# Patient Record
Sex: Female | Born: 1975 | Race: Black or African American | Hispanic: No | Marital: Married | Smoking: Current every day smoker
Health system: Southern US, Community
[De-identification: ages and names within clinical notes are randomized; demographics above are authoritative.]

## PROBLEM LIST (undated history)

## (undated) DIAGNOSIS — F259 Schizoaffective disorder, unspecified: Secondary | ICD-10-CM

## (undated) DIAGNOSIS — F603 Borderline personality disorder: Secondary | ICD-10-CM

## (undated) DIAGNOSIS — F209 Schizophrenia, unspecified: Secondary | ICD-10-CM

---

## 1998-01-07 ENCOUNTER — Emergency Department (HOSPITAL_COMMUNITY): Admission: EM | Admit: 1998-01-07 | Discharge: 1998-01-07 | Payer: Self-pay | Admitting: Emergency Medicine

## 1998-01-08 ENCOUNTER — Inpatient Hospital Stay (HOSPITAL_COMMUNITY): Admission: AD | Admit: 1998-01-08 | Discharge: 1998-01-13 | Payer: Self-pay | Admitting: *Deleted

## 2000-10-24 ENCOUNTER — Inpatient Hospital Stay (HOSPITAL_COMMUNITY): Admission: RE | Admit: 2000-10-24 | Discharge: 2000-10-24 | Payer: Self-pay | Admitting: *Deleted

## 2000-10-27 ENCOUNTER — Inpatient Hospital Stay (HOSPITAL_COMMUNITY): Admission: AD | Admit: 2000-10-27 | Discharge: 2000-10-27 | Payer: Self-pay | Admitting: *Deleted

## 2000-10-28 ENCOUNTER — Encounter (INDEPENDENT_AMBULATORY_CARE_PROVIDER_SITE_OTHER): Payer: Self-pay | Admitting: Specialist

## 2000-10-28 ENCOUNTER — Ambulatory Visit (HOSPITAL_COMMUNITY): Admission: RE | Admit: 2000-10-28 | Discharge: 2000-10-28 | Payer: Self-pay | Admitting: Obstetrics

## 2000-11-23 ENCOUNTER — Inpatient Hospital Stay (HOSPITAL_COMMUNITY): Admission: EM | Admit: 2000-11-23 | Discharge: 2000-11-25 | Payer: Self-pay | Admitting: Psychiatry

## 2001-02-09 ENCOUNTER — Encounter: Admission: RE | Admit: 2001-02-09 | Discharge: 2001-02-09 | Payer: Self-pay | Admitting: Infectious Diseases

## 2001-06-18 ENCOUNTER — Emergency Department (HOSPITAL_COMMUNITY): Admission: EM | Admit: 2001-06-18 | Discharge: 2001-06-18 | Payer: Self-pay

## 2001-07-02 ENCOUNTER — Inpatient Hospital Stay (HOSPITAL_COMMUNITY): Admission: EM | Admit: 2001-07-02 | Discharge: 2001-07-06 | Payer: Self-pay | Admitting: Psychiatry

## 2001-07-28 ENCOUNTER — Encounter: Payer: Self-pay | Admitting: Emergency Medicine

## 2001-07-28 ENCOUNTER — Emergency Department (HOSPITAL_COMMUNITY): Admission: EM | Admit: 2001-07-28 | Discharge: 2001-07-28 | Payer: Self-pay | Admitting: Emergency Medicine

## 2001-08-02 ENCOUNTER — Inpatient Hospital Stay (HOSPITAL_COMMUNITY): Admission: EM | Admit: 2001-08-02 | Discharge: 2001-08-04 | Payer: Self-pay | Admitting: Psychiatry

## 2002-06-21 ENCOUNTER — Other Ambulatory Visit: Admission: RE | Admit: 2002-06-21 | Discharge: 2002-06-21 | Payer: Self-pay | Admitting: Nephrology

## 2002-06-25 ENCOUNTER — Encounter: Admission: RE | Admit: 2002-06-25 | Discharge: 2002-06-25 | Payer: Self-pay | Admitting: Nephrology

## 2002-06-25 ENCOUNTER — Encounter: Payer: Self-pay | Admitting: Nephrology

## 2004-04-24 ENCOUNTER — Emergency Department (HOSPITAL_COMMUNITY): Admission: AC | Admit: 2004-04-24 | Discharge: 2004-04-24 | Payer: Self-pay

## 2008-04-22 ENCOUNTER — Encounter: Admission: RE | Admit: 2008-04-22 | Discharge: 2008-04-22 | Payer: Self-pay | Admitting: Unknown Physician Specialty

## 2009-05-06 ENCOUNTER — Emergency Department (HOSPITAL_COMMUNITY): Admission: EM | Admit: 2009-05-06 | Discharge: 2009-05-06 | Payer: Self-pay | Admitting: Emergency Medicine

## 2010-01-25 ENCOUNTER — Encounter: Payer: Self-pay | Admitting: Unknown Physician Specialty

## 2010-05-22 NOTE — Discharge Summary (Signed)
NAME:  Cynthia Good, Cynthia Good                       ACCOUNT NO.:  [id number]   MEDICAL RECORD NO.:  [id number]                   PATIENT TYPE:  PS   LOCATION:  0407                                 FACILITY:  [facility]   PHYSICIAN:  Reymundo Poll. Dub Mikes, M.D.                DATE OF BIRTH:  1975-10-08   DATE OF ADMISSION:  07/02/2001  DATE OF DISCHARGE:  07/06/2001                                 DISCHARGE SUMMARY   CHIEF COMPLAINT AND PRESENT ILLNESS:  This was one of multiple admissions to  [facility] for this 35 year old female referred by  [facility].  She was picked up by police naked, trying to take beer out  of a convenience store.  Thoughts were grossly disorganized.  Dancing and  singing, writing bizarre messages, paranoid, hostile, agitated during the  admission process.  Continued to be agitated and hostile 24 hours after  initial evaluation.   PAST PSYCHIATRIC HISTORY:  [facility], Susie  Venice, Willy Eddy two weeks ago, [facility] November 2002.  Multiple psychiatric admissions.  Previously on lithium and Haldol.   ALCOHOL/DRUG HISTORY:  History of alcohol and marijuana abuse.   PAST MEDICAL HISTORY:  Noncontributory.   MEDICATIONS:  Noncompliant.   PHYSICAL EXAMINATION:  Performed and failed to show any acute findings.   MENTAL STATUS EXAM:  Alert, overweight African-American female.  Normal  motor activity.  Agitated, intermittently distracted, disorganized thoughts  and guarded.  Paranoid manner.  Speech was pressured.  Mood hostile and  angry.  Some paranoia in her thinking process.  Oriented to person and  place.   ADMISSION DIAGNOSES:   AXIS I:  Schizoaffective disorder, manic.   AXIS II:  No diagnosis.   AXIS III:  No diagnosis.   AXIS IV:  Moderate.   AXIS V:  Global Assessment of Functioning upon admission 25; highest Global  Assessment of Functioning in the last year 50-55.   LABORATORY DATA:  CBC  was within normal limits.  Blood chemistries were  within normal limits.  Thyroid profile was within normal limits.  Drug  screen was positive for marijuana.  Other labs performed on the unit  included serum pregnancy was negative.  HIV was negative.  RPR was  nonreactive.  Chlamydia probe was nonreactive.   HOSPITAL COURSE:  She was admitted and started intensive individual and  group psychotherapy.  Medications were adjusted.  She was willing to go back  on the Haldol.  We eventually ended up giving her Haldol 5 mg twice a day,  Abilify 15 mg at bedtime, lithium carbonate 300 mg twice a day.  She was  also given Cogentin 1 mg twice a day and Seroquel 100 mg, 1-2 at bedtime for  sleep.  The first part of the stay was characterized as being intrusive,  anxious, irritable.  She was wanting to be discharged.  As she was not  allowed to leave, she escalated, becoming more intrusive, had to be  redirected over and over again.  By July 06, 2001, since she had complied  with treatment, was wanting to be discharged.  Being in the hospital was  becoming more of an issue for her.  There was no evidence of acute  symptomatology at that particular time.  Denied any suicidal or homicidal  ideation.  Was willing to pursue outpatient treatment.  In view of the fact  that staying on the unit was not helping but making things worse for her,  she was discharged to outpatient follow-up.   DISCHARGE DIAGNOSES:   AXIS I:  Schizoaffective disorder, manic.   AXIS II:  No diagnosis.   AXIS III:  No diagnosis.   AXIS IV:  Moderate.   AXIS V:  Global Assessment of Functioning upon discharge 50-55.   DISCHARGE MEDICATIONS:  1. Lithobid 300 mg twice a day.  2. Abilify 15 mg at bedtime.  3. Haldol 5 mg twice a day.  4. Cogentin 10 mg twice a day.  5. Seroquel 100 mg, 1-2 at bedtime.   FOLLOW UP:  [facility].                                               Madie Reno A. Dub Mikes, M.D.    IAL/MEDQ  D:   08/09/2001  T:  08/10/2001  Job:  [id number]

## 2010-05-22 NOTE — Op Note (Signed)
[facility]  Patient:    Cynthia Good, Cynthia Good Visit Number: [id number] MRN: [id number]          Service Type: DSU Location: [facility] Attending Physician:  Tammi Sou Dictated by:   Bing Neighbors Clearance Coots, M.D. Proc. Date: 10/28/00 Admit Date:  10/28/2000 Discharge Date: 10/28/2000   CC:         [facility]   Operative Report  PREOPERATIVE DIAGNOSIS:       Intrauterine fetal demise at seven weeks                               gestation by ultrasound.  POSTOPERATIVE DIAGNOSIS:      Intrauterine fetal demise at seven weeks                               gestation by ultrasound.  OPERATION:                    Dilation and evacuation.  SURGEON:                      Charles A. Clearance Coots, M.D.  ANESTHESIA:                   MAC with paracervical block.  ESTIMATED BLOOD LOSS:         100 ml.  COMPLICATIONS:                None.  SPECIMENS:                    Products of conception.  DESCRIPTION OF PROCEDURE:     The patient was brought to the operating room and after satisfactory IV sedation, the legs were brought up in stirrups, and the vagina was prepped and draped in the usual sterile fashion.  The urinary bladder was emptied of approximately 50 ml of clear urine.  Bimanual examination revealed the uterus to be mid position, about 7-8 weeks size.  A sterile speculum was inserted in the vaginal vault and the cervix was isolated.  The anterior lip of the cervix was grasped with a single tooth tenaculum.  Paracervical block with 2% Xylocaine with 2 ml of sodium bicarbonate, approximately 20 ml was injected in the lateral fornices, approximately 8 ml in each lateral fornix at 3 and 9 oclock position, and about 4 ml on the anterior lip of the cervix prior to application of the single tooth tenaculum.  The uterus was then sounded and the cervix was dilated to approximately 25 ml with the [facility] dilator.  A #8 suction cath  was then easily introduced into the uterine cavity and all contents were evacuated.  The endometrial surfaces were curetted with a small sharp curet and no further products of conception were obtained.  There was no active bleeding at the conclusion of the procedure.  All instruments were ______.  The patient tolerated the procedure well and was transported to the recovery room in satisfactory condition. Dictated by:   Bing Neighbors Clearance Coots, M.D. Attending Physician:  Tammi Sou DD:  10/29/00 TD:  10/31/00 Job: [id number]

## 2010-05-22 NOTE — Discharge Summary (Signed)
[facility]  Patient:    Cynthia Good, Cynthia Good Visit Number: [id number] MRN: [id number]          Service Type: PSY Location: 400 [id number] [id number] Attending Physician:  Rachael Fee Dictated by:   Reymundo Poll Dub Mikes, M.D. Admit Date:  11/23/2000 Discharge Date: 11/25/2000                             Discharge Summary  CHIEF COMPLAINT AND PRESENT ILLNESS:  This was the first admission to [facility] for this 35 year old female involuntarily committed. Reported that she took 12 mg of Haldol and 6 mg of Cogentin to go to sleep. Has not slept in 24 hours.  Presented herself to mental health complaining of insomnia and feeling manic, walking 3-4 miles per day, sleeping less than five hours in the last week, frequent unprotected sex, has been off Haldol and lithium since September, when he told mental health worker that she was pregnant.  Subsequently lost the baby.  Denied any auditory or visual hallucinations.  Promised safety while on the unit.  PAST PSYCHIATRIC HISTORY:  Followed at [facility], [place] ______.  Fourth psychiatric admission.  Last in [facility] October 2002.  ALCOHOL/DRUG HISTORY:  Drinks beer 24 hours daily.  No drugs.  Smokes marijuana occasionally.  MEDICAL PROBLEMS:  Positive for STDs.  No seizures.  MEDICATIONS:  Haldol 4 mg h.s., lithium 300 mg, 2 tabs b.i.d.  MENTAL STATUS EXAMINATION:  Alert, obese female, cooperative, polite. Irritable affect.  Rapid pace.  Speech relevant and appropriate response. Mood irritable and labile.  Thought process logical but distractible. Initially tangential.  No suicidal or homicidal ideation.  Cognition well-preserved.  ADMISSION DIAGNOSES: Axis I:    1. Bipolar disorder.            2. Alcohol dependence. Axis II:   Deferred. Axis III:  High blood pressure. Axis IV:   Moderate. Axis V:    Global Assessment of Functioning upon admission 30; highest Global  Assessment of Functioning in the last year 68.  LABORATORY DATA:  CBC was within normal limits.  Blood chemistries were within normal limits.  Thyroid profile was within normal limits.  Drug screen was negative for substances of abuse.  HOSPITAL COURSE:  She was admitted and started intensive individual and group psychotherapy.  She was placed back on her Haldol and Cogentin.  She claimed that she started feeling better.  She was not feeling as hyper as she was when she was admitted.  She was wanting to be discharged.  Social worker, in conversation with the family, stated she was going to be returning home.  On November 25, 2000, in full contact with reality.  Mood improved.  Affect bright.  No active psychosis.  No pressured speech.  No hyperactivity.  No psychomotor agitation.  No flight of ideas.  Wanted to be discharged and stay with the boyfriend.  Did not want to listen to her aunt.  As there were no suicidal ideation, no homicidal ideation, no active psychosis and she was in full contact with reality, she was discharged to outpatient followup.  DISCHARGE DIAGNOSES: Axis I:    1. Bipolar disorder, manic.            2. Alcohol abuse. Axis II:   No diagnosis. Axis III:  High blood pressure. Axis IV:   Moderate. Axis V:    Global Assessment of Functioning upon  discharge 55-60.  DISCHARGE MEDICATIONS: 1. Haldol 2 mg, 3 q.d. 2. Lithobid 300 mg, two b.i.d. 3. Cogentin 1 mg b.i.d.  FOLLOWUP:  [facility]. Dictated by:   Reymundo Poll Dub Mikes, M.D. Attending Physician:  Rachael Fee DD:  01/10/01 TD:  01/11/01 Job: [id number]

## 2010-05-22 NOTE — H&P (Signed)
NAME:  Cynthia Good, Cynthia Good                       ACCOUNT NO.:  [id number]   MEDICAL RECORD NO.:  [id number]                   PATIENT TYPE:  IPS   LOCATION:  0407                                 FACILITY:  [facility]   PHYSICIAN:  Viviann Spare, N.P.              DATE OF BIRTH:  04-Aug-1975   DATE OF ADMISSION:  08/02/2001  DATE OF DISCHARGE:                         PSYCHIATRIC ADMISSION ASSESSMENT   IDENTIFYING INFORMATION:  This is a 35 year old, African-American female who  is single and voluntary admission.   HISTORY OF PRESENT ILLNESS:  This patient was picked up by police wandering  around naked downtown. She was hostile, paranoid and agitated in the  emergency room and had to be restrained. She was treated there with 20 mg of  Geodon and was calmer but remained uncooperative. She was involuntary  petitioned by the emergency room physician. Today, she is somewhat calmer  but obviously continues to be internally distracted. She is oppositional,  pacing, hyper verbal and refusing p.o. meds. She redirects with difficulty.  It is noted that her previous admission here at [facility] was under identical circumstances. She was discharged from here on  July 3 and has apparently been noncompliant with her medications.   PAST PSYCHIATRIC HISTORY:  This is the second admission to [facility]. The patient's full psychiatric history is unclear  and she is a poor historian being unable to provide information. She does  have a past history of ETOH abuse and in a previous record noted that she  had been trying to take beer from a convenience store and has reported that  she drinks beer all day long every day as much as she can get. She has shown  no signs of withdrawal on this or prior admission. Her urine drug screen on  this admission is positive for THC. The patient has refused Zyprexa on a  previous admission stating that it caused her weight  gain and she had  reported previously that she had been well stabilized on Neurontin, Abilify,  Haldol and Klonopin which she was willing to take. She had apparently been  followed by [facility] but her compliance is unclear.   SOCIAL HISTORY:  This is a single, African-American female, never married,  no children. She does have a multi-language background. She lives with her  aunt in [place]. She apparently is not working although her disability  status and source of income is unclear.   FAMILY HISTORY:  Unclear.   ALCOHOL AND DRUG HISTORY:  The patient does have some history of  polysubstance abuse as noted above. Urine drug screen was positive for THC  in the emergency room.   MEDICAL HISTORY:  Primary care Dhanvi Boesen is unclear. Medical problems include  elevated blood pressure on admission which has normalized by today. The  patient denies any other acute problems. Past medical history, according to  previous record, is remarkable for sexually transmitted diseases and she was  screened on a previous admission with negative results.   MEDICATIONS:  None. She has apparently not taken any medications. She was  discharged on July 3 on Litobid 300 mg p.o. twice daily, Abilify 15 mg at  bedtime, Haldol 5 mg p.o. b.i.d., Cogentin 1 mg twice daily, Seroquel 100 mg  one to two tablets at bedtime.   DRUG ALLERGIES:  ZYPREXA causes weight gain, DEPAKOTE causes rash. The  patient also reports she is allergic to TYLENOL, RISPERDAL and BENADRYL.   POSITIVE PHYSICAL FINDINGS:  They were unable to do a physical exam on this  patient in the ER because of her agitation and her refusal to cooperate. She  continues to be unable to tolerate a physical exam at this time although she  appears to be generally healthy in appearance. Her gait is steady, grossly  normal motor exam. Her vital signs are within normal limits now this  morning. Her temp was 97.4 on admission, pulse 90,  respirations 20 and her  blood pressure this morning 124/86. Her pulse ox is 97 percent. She weighed  231 on admission and is approximately 5 feet 5 inches tall. She was taking  fluids well on the p.m. shift last night having received about 420 cc fluid  noted around midnight. Initial diagnostic studies done reveal a hemoglobin  14, hematocrit 41. Her electrolytes were within normal limits with a  potassium 3.5, sodium 142, a BUN of  7, creatinine 1.7. A urine pregnancy  test was negative. A urine drug screen was positive for  tetrahydrocannabinols. Her urinalysis revealed cloudy urine with a specific  gravity of 1.039. She did have T counts of 15, a moderate bilirubin,  moderate amount of blood, 30 mg of protein, nitrites negative, leukocytes  moderate and squamous epithelial many, WBC's revealed 2 per high power field  and RBC's 21:50. The patient is unable to give a reliable history about her  menses.   MENTAL STATUS EXAM:  This is a large-built, African-American female with  grossly normal motor function. No EPS or TD are noted at this time. She is  fully alert with a blunted affect. She is pacing, redirects with moderate  difficulty. Her affect is labile. Speech is pressured and hyper verbal with  considerable response latency. Mood is labile and irritable. She is quite  oppositional. Thought process shows considerable latency, racing thoughts.  She is obviously distracted. She appears to be internally distracted. Her  thoughts are disorganized and she displays flight of ideas. Mood is somewhat  hyper-religious. She is concerned about what church people belong to and  wanting to only deal with individuals from a specific church denomination.  She was also hyper sexual referencing sexual encounters in her conversation  yesterday. No overt evidence of suicidal ideation or homicidal ideation. The patient's primary concern is to be left alone, not take any medicine and get  out of here.  Cognitively, she is intact to person and place but not to time  or date. She is an unreliable historian. Insight is nonexistent.  Intelligence is average to above-average, judgment is impaired, impulse  control is impaired.   AXIS I:  1. Bipolar I Disorder, Manic.  2. Cannabis abuse.  3. History of ETOH abuse.   AXIS II:  Deferred.   AXIS III:  None.   AXIS IV:  Deferred.   AXIS V:  Current: 20, Past year: 62.   PLAN:  Plan is to involuntary admit the patient to stabilize her mood and to  alleviated her psychosis with 1:1 observation for safety in place at this  time and this will continue until she is redirectible and cooperative. We  will ask the case manager to contact the patient's aunt for additional  history. We have initially treated her with Geodon 20 mg IM in the emergency  room yesterday morning followed by an additional 20 mg of Geodon along with  2 mg of Ativan IM approximately 4:15 p.m. yesterday. This was followed by  continued refusal to take p.o. meds and we initiated it around bedtime. She  had an additional Haldol 5 mg IM and Ativan 2 mg IM last evening and again  at  6:30 this morning. We will continue with Haldol 5 mg IM or p.o. and Ativan 2  mg IM or p.o. q.4h. p.r.n. Meanwhile, her thyroid and lithium levels are  currently pending and we will attempt to gather some additional history. We  are monitoring fluid intake on her and getting her vital signs q.i.d.  Estimated length of stay is 10 days.                                               Viviann Spare, N.P.    MAS/MEDQ  D:  08/03/2001  T:  08/04/2001  Job:  [phone]

## 2010-05-22 NOTE — Discharge Summary (Signed)
NAME:  Cynthia Good, Cynthia Good                       ACCOUNT NO.:  [id number]   MEDICAL RECORD NO.:  [id number]                  PATIENT TYPE:  IPS   LOCATION:  0407                                 FACILITY:  [facility]   PHYSICIAN:  Geoffery Lyons, MD                     DATE OF BIRTH:  15-Aug-1975   DATE OF ADMISSION:  08/02/2001  DATE OF DISCHARGE:  08/04/2001                                 DISCHARGE SUMMARY   CHIEF COMPLAINT AND PRESENT ILLNESS:  This was the second admission to [facility] for this 35 year old female, single,  involuntarily admitted.  She was picked up by police wandering around naked  [place]town.  She was hostile, paranoid, agitated, had to be restrained.  She  was treated with 20 mg Geodon.  She was calm but remained uncooperative.  She was involuntarily petitioned by the emergency room physician.  She was  somewhat calmer upon this evaluate although actively hallucinating,  distractible, hyperverbal, anxious, easily agitated, and with threatening  behavior.  She was noncompliant with medications.   PAST PSYCHIATRIC HISTORY:  Previously at [facility]; noncompliant with medications once discharged.  She had a previous  history of alcohol use, reported that she drinks all day long as much as she  can.   SUBSTANCE ABUSE HISTORY:  As already stated, increased use of alcohol.  Drug  screen was positive for marijuana.   PAST MEDICAL HISTORY:  Sexually transmitted disease; previous admission with  negative results.   MEDICATIONS:  She was discharged on July 3 on:  1. Lithobid 300 mg twice a day.  2. Abilify 15 mg at bedtime.  3. Haldol 5 mg twice a day.  4. Cogentin 1 mg twice a day.  5. Seroquel 100 mg one to two at bedtime.   PHYSICAL EXAMINATION:  Physical examination was performed, failed to show  any acute findings.   MENTAL STATUS EXAM:  Mental status exam revealed a large built, African-  American female, grossly  normal motor functioning, no ________ or DT, fully  alert, blunted affect.  Speech: Speech latency.  Very labile, very  irritable, angry, oppositional, racing thoughts, hyperreligious, concerned  about the church people, hypersexual.  Cognitive: Cognition affected by the  acute psychotic process.   ADMISSION DIAGNOSES:   AXIS I:  1. Schizoaffective disorder, manic.  2. Alcohol and marijuana abuse.   AXIS II:  No diagnosis.   AXIS III:  No diagnosis.   AXIS IV:  Moderate.   AXIS V:  Global assessment of functioning upon admission 25, highest global  assessment of functioning in the last year 55-60.   HOSPITAL COURSE:  She was admitted and started in intensive individual and  group psychotherapy.  The course of the stay was characterized by her  agitation, being completely uncooperative, threatening behavior that was  affecting other patients on the unit.  As  she was completely noncompliant,  not accepting medications, she had to be searched at one particular time, we  went ahead and transferred to [facility] for further treatment.   DISCHARGE DIAGNOSES:   AXIS I:  1. Schizoaffective disorder, manic.  2. Alcohol and marijuana abuse.   AXIS II:  No diagnosis.   AXIS III:  No diagnosis.   AXIS IV:  Moderate.   AXIS V:  Global assessment of functioning upon discharge 30.   DISCHARGE MEDICATIONS:  She was discharged on no medications.   FOLLOW UP:  She was to be reassessed by the staff at [facility].                                                 Geoffery Lyons, MD    IL/MEDQ  D:  09/06/2001  T:  09/06/2001  Job:  [id number]

## 2010-09-28 IMAGING — CR DG KNEE COMPLETE 4+V*R*
5 series · 5 of 5 positions shown · non-contrast
Comparison: None.

CLINICAL DATA: Knee pain.

RIGHT KNEE - COMPLETE 4+ VIEW

[t knee ap right]
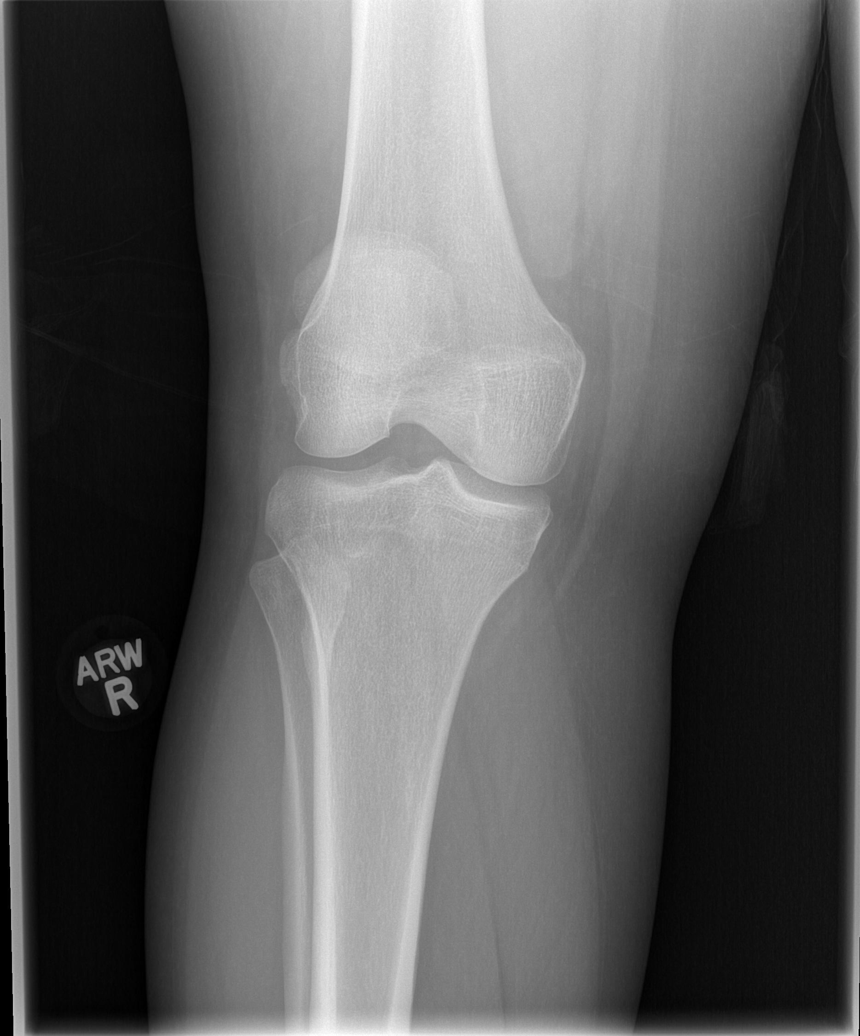

[t knee oblique right (1 of 3)]
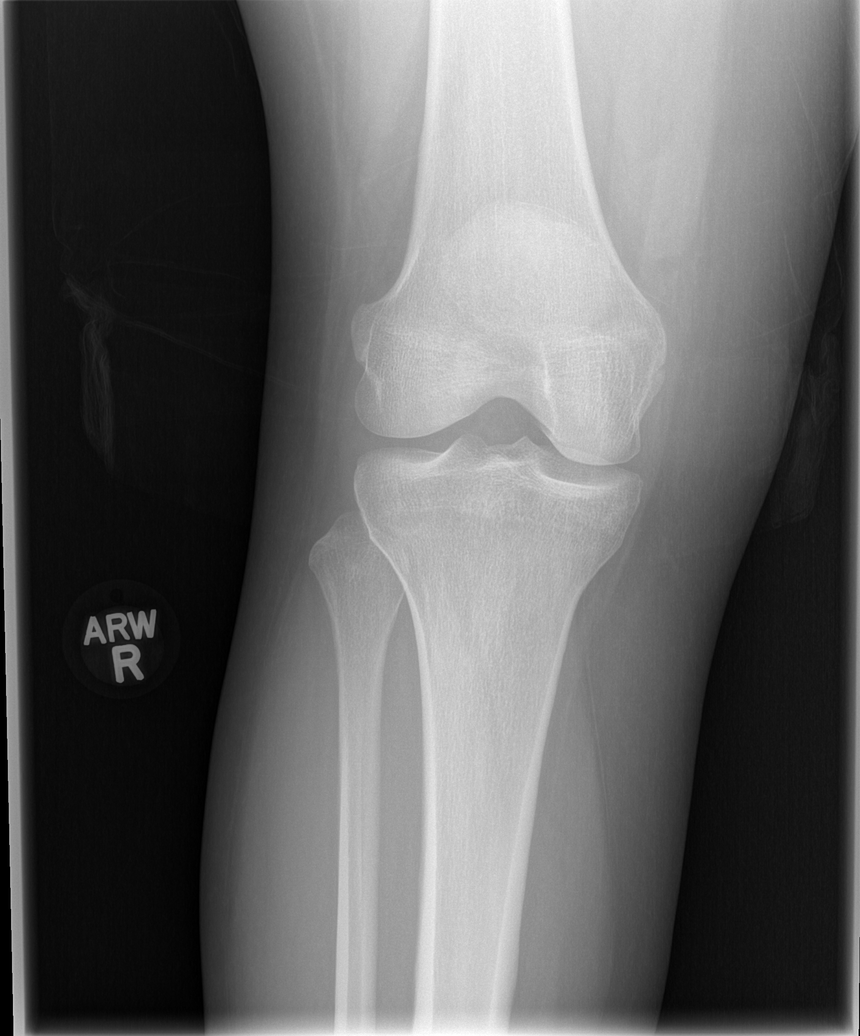

[t knee oblique right (2 of 3)]
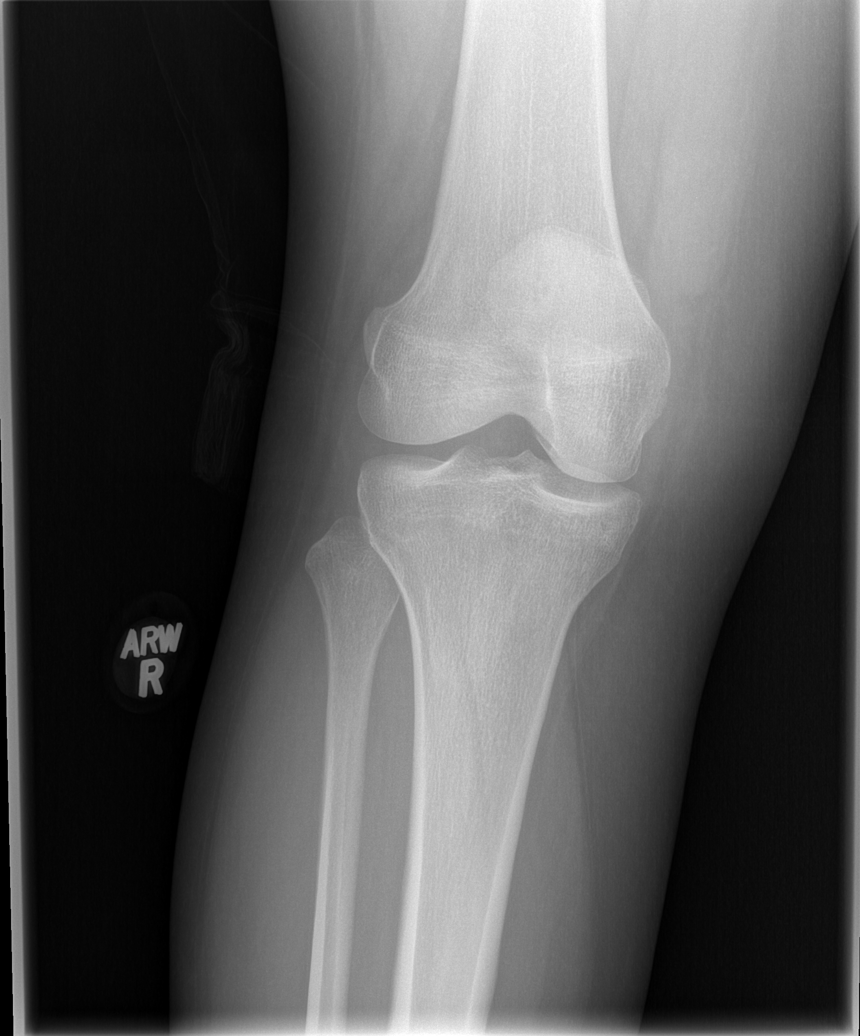

[t knee oblique right (3 of 3)]
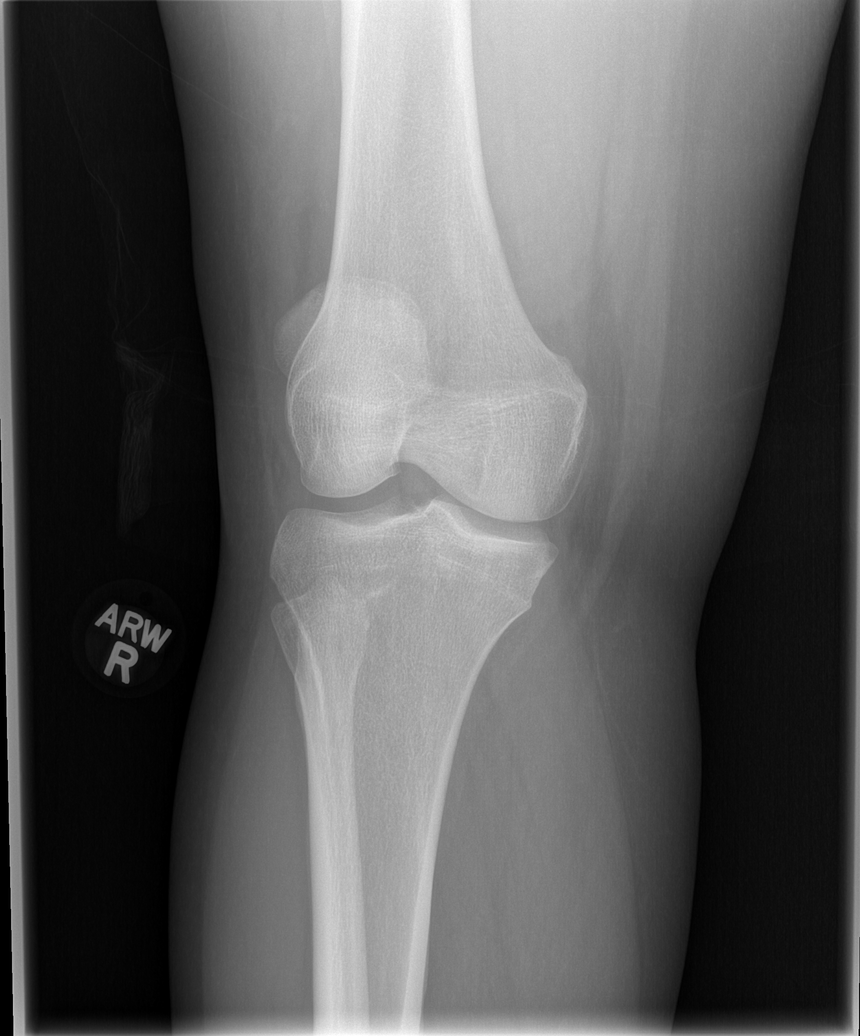

[t knee lat right]
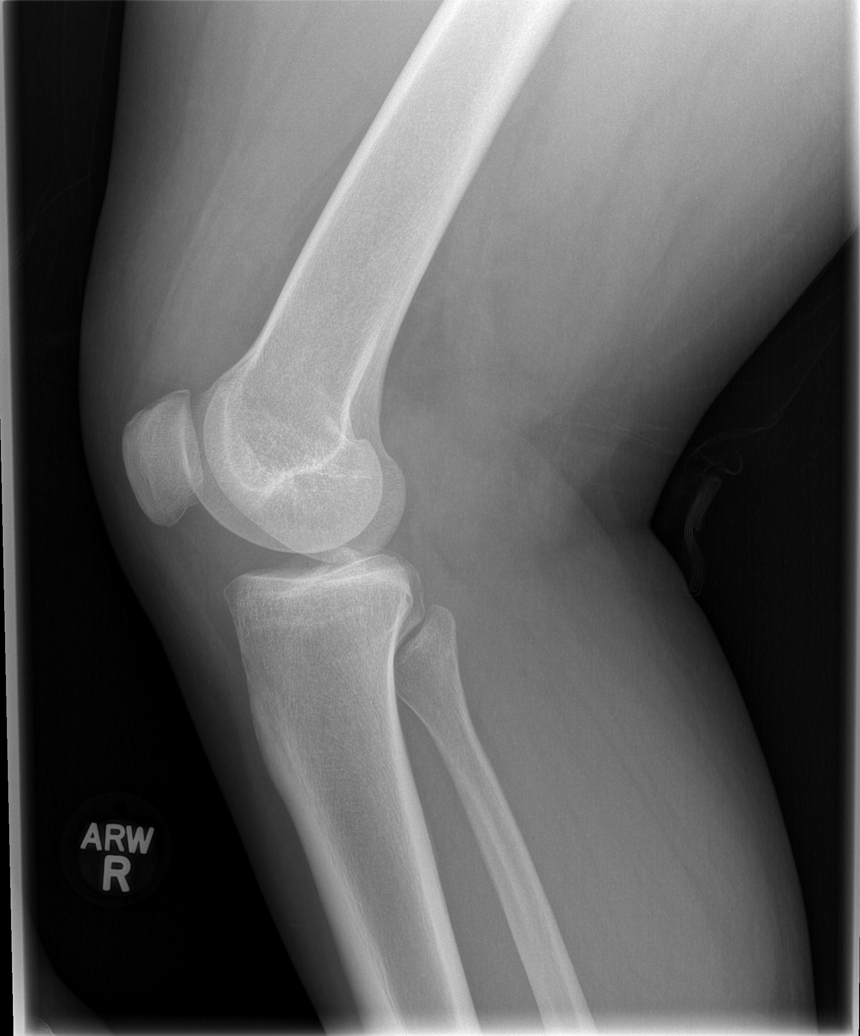

[5 of 5 positions shown; findings below may reference images not displayed]

FINDINGS: The right knee is located.  No significant effusion is
present.  No acute bone or soft tissue abnormality is evident.
IMPRESSION: Negative right knee.

## 2011-06-30 ENCOUNTER — Encounter (HOSPITAL_COMMUNITY): Payer: Self-pay | Admitting: *Deleted

## 2011-06-30 ENCOUNTER — Emergency Department (HOSPITAL_COMMUNITY)
Admission: EM | Admit: 2011-06-30 | Discharge: 2011-07-02 | Disposition: A | Discharge status: Home / Self Care | Payer: Medicaid Other | Attending: Emergency Medicine | Admitting: Emergency Medicine

## 2011-06-30 DIAGNOSIS — F29 Unspecified psychosis not due to a substance or known physiological condition: Secondary | ICD-10-CM | POA: Insufficient documentation

## 2011-06-30 DIAGNOSIS — F259 Schizoaffective disorder, unspecified: Secondary | ICD-10-CM | POA: Insufficient documentation

## 2011-06-30 DIAGNOSIS — F17200 Nicotine dependence, unspecified, uncomplicated: Secondary | ICD-10-CM | POA: Insufficient documentation

## 2011-06-30 HISTORY — DX: Schizophrenia, unspecified: F20.9

## 2011-06-30 HISTORY — DX: Schizoaffective disorder, unspecified: F25.9

## 2011-06-30 HISTORY — DX: Borderline personality disorder: F60.3

## 2011-06-30 LAB — RAPID URINE DRUG SCREEN, HOSP PERFORMED
Amphetamines: NOT DETECTED
Barbiturates: NOT DETECTED
Benzodiazepines: NOT DETECTED
Cocaine: NOT DETECTED
Opiates: NOT DETECTED
Tetrahydrocannabinol: POSITIVE — AB

## 2011-06-30 LAB — COMPREHENSIVE METABOLIC PANEL WITH GFR
ALT: 7 U/L (ref 0–35)
AST: 19 U/L (ref 0–37)
Albumin: 4.2 g/dL (ref 3.5–5.2)
Alkaline Phosphatase: 61 U/L (ref 39–117)
BUN: 8 mg/dL (ref 6–23)
CO2: 21 meq/L (ref 19–32)
Calcium: 10.3 mg/dL (ref 8.4–10.5)
Chloride: 100 meq/L (ref 96–112)
Creatinine, Ser: 0.79 mg/dL (ref 0.50–1.10)
GFR calc Af Amer: >90 mL/min (ref >90)
GFR calc non Af Amer: >90 mL/min (ref >90)
Glucose, Bld: 99 mg/dL (ref 70–99)
Potassium: 3.3 meq/L — ABNORMAL LOW (ref 3.5–5.1)
Sodium: 135 meq/L (ref 135–145)
Total Bilirubin: 0.3 mg/dL (ref 0.3–1.2)
Total Protein: 8.1 g/dL (ref 6.0–8.3)

## 2011-06-30 LAB — CBC
MCH: 30 pg (ref 26.0–34.0)
MCHC: 34.5 g/dL (ref 30.0–36.0)
MCV: 87 fL (ref 78.0–100.0)
RDW: 12.9 % (ref 11.5–15.5)

## 2011-06-30 MED ORDER — ZIPRASIDONE MESYLATE 20 MG IM SOLR
INTRAMUSCULAR | Status: AC
  Administered 2011-06-30: 04:00:00
  Filled 2011-06-30: qty 20

## 2011-06-30 MED ORDER — ZIPRASIDONE MESYLATE 20 MG IM SOLR
INTRAMUSCULAR | Status: AC
  Administered 2011-06-30: 20 mg via INTRAMUSCULAR
  Filled 2011-06-30: qty 20

## 2011-06-30 MED ORDER — HALOPERIDOL LACTATE 5 MG/ML IJ SOLN
INTRAMUSCULAR | Status: AC
  Administered 2011-06-30: 10 mg via INTRAMUSCULAR
  Filled 2011-06-30: qty 2

## 2011-06-30 MED ORDER — HALOPERIDOL DECANOATE 100 MG/ML IM SOLN
80.0000 mg | Freq: Once | INTRAMUSCULAR | Status: AC
  Administered 2011-06-30: 80 mg via INTRAMUSCULAR
  Filled 2011-06-30: qty 0.8

## 2011-06-30 MED ORDER — LORAZEPAM 2 MG/ML IJ SOLN
INTRAMUSCULAR | Status: AC
  Administered 2011-06-30: 2 mg via INTRAMUSCULAR
  Filled 2011-06-30: qty 1

## 2011-06-30 MED ORDER — LORAZEPAM 1 MG PO TABS
2.0000 mg | ORAL_TABLET | Freq: Once | ORAL | Status: AC
  Administered 2011-06-30: 2 mg via ORAL
  Filled 2011-06-30: qty 2

## 2011-06-30 MED ORDER — LORAZEPAM 1 MG PO TABS
2.0000 mg | ORAL_TABLET | Freq: Three times a day (TID) | ORAL | Status: DC
  Administered 2011-07-01 – 2011-07-02 (×4): 2 mg via ORAL
  Filled 2011-06-30 (×5): qty 2

## 2011-06-30 MED ORDER — HALOPERIDOL 5 MG PO TABS
10.0000 mg | ORAL_TABLET | Freq: Two times a day (BID) | ORAL | Status: DC
  Administered 2011-07-01 – 2011-07-02 (×3): 10 mg via ORAL
  Filled 2011-06-30 (×3): qty 2
  Filled 2011-06-30 (×2): qty 1

## 2011-06-30 MED ORDER — ALUM & MAG HYDROXIDE-SIMETH 200-200-20 MG/5ML PO SUSP
30.0000 mL | ORAL | Status: DC | PRN
  Filled 2011-06-30: qty 30

## 2011-06-30 MED ORDER — HALOPERIDOL LACTATE 5 MG/ML IJ SOLN
10.0000 mg | Freq: Once | INTRAMUSCULAR | Status: AC
  Administered 2011-06-30 (×2): 10 mg via INTRAMUSCULAR
  Filled 2011-06-30: qty 2

## 2011-06-30 MED ORDER — BENZTROPINE MESYLATE 1 MG/ML IJ SOLN
2.0000 mg | Freq: Once | INTRAMUSCULAR | Status: AC
  Administered 2011-06-30: 2 mg via INTRAMUSCULAR
  Filled 2011-06-30: qty 2

## 2011-06-30 MED ORDER — BENZTROPINE MESYLATE 1 MG PO TABS
2.0000 mg | ORAL_TABLET | Freq: Two times a day (BID) | ORAL | Status: DC
  Administered 2011-06-30 – 2011-07-02 (×4): 2 mg via ORAL
  Filled 2011-06-30: qty 2
  Filled 2011-06-30 (×3): qty 1
  Filled 2011-06-30 (×2): qty 2
  Filled 2011-06-30: qty 1

## 2011-06-30 MED ORDER — LORAZEPAM 2 MG/ML IJ SOLN
INTRAMUSCULAR | Status: AC
  Administered 2011-06-30: 2 mg
  Filled 2011-06-30: qty 1

## 2011-06-30 MED ORDER — ZIPRASIDONE MESYLATE 20 MG IM SOLR
20.0000 mg | Freq: Once | INTRAMUSCULAR | Status: AC
  Administered 2011-06-30: 20 mg via INTRAMUSCULAR

## 2011-06-30 MED ORDER — LORAZEPAM 2 MG/ML IJ SOLN
2.0000 mg | Freq: Once | INTRAMUSCULAR | Status: AC
  Administered 2011-06-30 (×2): 2 mg via INTRAMUSCULAR
  Filled 2011-06-30: qty 1

## 2011-06-30 MED ORDER — NICOTINE 21 MG/24HR TD PT24
21.0000 mg | MEDICATED_PATCH | Freq: Once | TRANSDERMAL | Status: DC
  Filled 2011-06-30: qty 1

## 2011-06-30 MED ORDER — ONDANSETRON HCL 4 MG PO TABS: 4.0000 mg | ORAL_TABLET | Freq: Three times a day (TID) | ORAL | Status: DC | PRN

## 2011-06-30 NOTE — ED Notes (Signed)
Pt brought in by ems from crisis center; pt manic at crisis center and would not sit down for assessment; flight of ideas; talking loudly; speaking different languages.

## 2011-06-30 NOTE — ED Notes (Signed)
Meal offered. Pt refused

## 2011-06-30 NOTE — ED Notes (Addendum)
Patient at desk rambling. States "I'm so tired of my life. I'm tired of being in mental hospitals. I feel like putting a gun to my head and pulling the trigger." Patient continues to ramble, yelling and cursing the staff. "I need a fucking cigarette. I need a fucking job. I don't have any GD money. I need a GD job". Telling RN to "eat the shit out of my ass." Telling RN "fuck you mother fucker". Pt told to stay in her room. Patient states "I don't want to go in my room I want a fucking cigarette." Patient slamming door and pacing. [employer] and security in room with pt. Patient being verbally aggressive toward staff in room. Patient asked to lie on bed. Patient refusing. Patient acting aggressive toward staff, yelling and threatening to kill herself. [employer] and security officer scuffled patient onto bed. Patient screaming to officers repeatedly "I'm gonna kick you in the balls".  Patient was placed in 4 point restraints.

## 2011-06-30 NOTE — BH Assessment (Signed)
Assessment Note   Cynthia Good is an 36 y.o. female. Pt comes in with GPD brought IVC from [facility]. Pt was having flight of ideas, manic behavior, speaking loudly and in different languages. Pt denied SI, but made several remarks about losing her will to live due to being homeless. Pt stated she was a resident at [facility] but did not wish to stay there for them to keep getting disability money. Pt remarked that she "often times walks the streets because the busses run too slow and that [place] is too slow" and stated she wanted to go to [place]. Pt denied hearing voices or seeing things, prior to being asked, but did appear to be responding to internal stimuli at times. She was labile, going from yelling and angry to softer, tearful times. Pt exhibited a flight of ideas, tangential thoughts and was restless during the assessment. Pt was irritable, agitated and made numerous comments about not wanting to be on her medications. Pt stated she was scheduled to get her next dose of medication yesterday, but missed this appointment. Pt's POA is Tessie Fass at [employer] and she sees Quarry manager at [employer]. Spoke with Amy, the director at [facility] who stated pt had left yesterday evening after making comments about harming/killing herself.  She stated pt does have hx of SA (THC & Cocaine) and that pt is always "loud,obnoxious and disturbing". Amy did state that pt can return to their facility once she has been stabilized.  Axis I: Schizoaffective DO (Per pt hx) and Cannibus Abuse Axis II: Cluster B Traits Axis III:  Past Medical History  Diagnosis Date  . Schizoaffective disorder   . Schizophrenia   . Borderline personality disorder    Axis IV: other psychosocial or environmental problems and problems related to social environment Axis V: 21-30 behavior considerably influenced by delusions or hallucinations OR serious impairment in judgment, communication OR inability to function  in almost all areas  Past Medical History:  Past Medical History  Diagnosis Date  . Schizoaffective disorder   . Schizophrenia   . Borderline personality disorder     History reviewed. No pertinent past surgical history.  Family History: No family history on file.  Social History:  does not have a smoking history on file. She does not have any smokeless tobacco history on file. She reports that she drinks alcohol. Her drug history not on file.  Additional Social History:  Alcohol / Drug Use Pain Medications: N/A Prescriptions: See PTA List Over the Counter: N/A History of alcohol / drug use?: Yes Longest period of sobriety (when/how long): Unknown Substance #1 Name of Substance 1: THC 1 - Age of First Use: Unknown 1 - Amount (size/oz): Unknown 1 - Frequency: Unknown 1 - Duration: Unknown 1 - Last Use / Amount: Pt was positive for THC in UDS 06/30/11  CIWA: CIWA-Ar BP: 113/87 mmHg Pulse Rate: 89  COWS:    Allergies:  Allergies  Allergen Reactions  . Antihistamines, Chlorpheniramine-Type     Home Medications:  (Not in a hospital admission)  OB/GYN Status:  No LMP recorded.  General Assessment Data Location of Assessment: [facility] Living Arrangements: Other (Comment) ([facility]) Can pt return to current living arrangement?: Yes Admission Status: Involuntary Is patient capable of signing voluntary admission?: No Transfer from: Acute Hospital Referral Source:  ([facility] - [facility])  Education Status Is patient currently in school?: No Contact person: Tessie Fass @ [facility]  Risk to self  Suicidal Ideation: No (Denied but made comment of "losing her will to live") Suicidal Intent: No-Not Currently/Within Last 6 Months Is patient at risk for suicide?: Yes Suicidal Plan?: No Access to Means: No What has been your use of drugs/alcohol within the last 12 months?: Positive for THC - unknown amount or date of use Previous Attempts/Gestures: No (pt  denied) Triggers for Past Attempts: None known Intentional Self Injurious Behavior: None Family Suicide History: Unknown Recent stressful life event(s): Other (Comment) (Pt wants to stop medications) Persecutory voices/beliefs?: Yes Depression: No Substance abuse history and/or treatment for substance abuse?: Yes Suicide prevention information given to non-admitted patients: Not applicable  Risk to Others Homicidal Ideation: No-Not Currently/Within Last 6 Months Thoughts of Harm to Others: Yes-Currently Present (Remarked would hurt staff to get out to smoke) Comment - Thoughts of Harm to Others: Stated to ED staff would fight to get outside to leave & smoke Current Homicidal Intent: No-Not Currently/Within Last 6 Months Current Homicidal Plan: No-Not Currently/Within Last 6 Months Access to Homicidal Means: No Identified Victim: Hospital staff History of harm to others?:  (Unknown) Assessment of Violence: On admission Violent Behavior Description: Verbally abusive, uncooperative at times Does patient have access to weapons?: No Criminal Charges Pending?: No Does patient have a court date: No  Psychosis Hallucinations: Auditory (Pt denied, but seemed to be responding to internal stimuli) Delusions: Unspecified (Feels [place] is "too slow" for her, the busses are too slow too)  Mental Status Report Appear/Hygiene: Disheveled;Body odor;Poor hygiene Eye Contact: Fair Motor Activity: Freedom of movement;Gestures Speech: Aggressive;Argumentative;Pressured;Tangential;Language other than English (Would occassionally speak spanish words) Level of Consciousness: Alert;Restless;Combative;Crying Mood: Labile;Suspicious;Angry;Threatening Affect: Irritable;Angry;Threatening Anxiety Level: Minimal Thought Processes: Coherent;Flight of Ideas;Tangential Judgement: Impaired Orientation: Person;Place;Time;Situation Obsessive Compulsive Thoughts/Behaviors: None  Cognitive  Functioning Concentration: Decreased Memory: Recent Intact;Remote Intact IQ: Average Insight: Poor Impulse Control: Poor Appetite: Good Weight Loss: 0  Weight Gain: 0  Sleep: Decreased ("I do not sleep sometimes") Vegetative Symptoms: Decreased grooming  ADLScreening [facility]) Patient's cognitive ability adequate to safely complete daily activities?: Yes Patient able to express need for assistance with ADLs?: Yes Independently performs ADLs?: Yes  Abuse/Neglect [facility]) Physical Abuse: Denies Verbal Abuse: Denies Sexual Abuse: Denies  Prior Inpatient Therapy Prior Inpatient Therapy: Yes Prior Therapy Dates: May 2006 Prior Therapy Facilty/Provider(s): [facility] Reason for Treatment: Psychosis  Prior Outpatient Therapy Prior Outpatient Therapy: Yes Prior Therapy Dates: Current Prior Therapy Facilty/Provider(s): [facility] Reason for Treatment: Schizoaffective/BPD  ADL Screening (condition at time of admission) Patient's cognitive ability adequate to safely complete daily activities?: Yes Patient able to express need for assistance with ADLs?: Yes Independently performs ADLs?: Yes       Abuse/Neglect Assessment (Assessment to be complete while patient is alone) Physical Abuse: Denies Verbal Abuse: Denies Sexual Abuse: Denies Exploitation of patient/patient's resources: Denies Self-Neglect: Denies Values / Beliefs Cultural Requests During Hospitalization: None Spiritual Requests During Hospitalization: None   Advance Directives (For Healthcare) Advance Directive: Patient does not have advance directive;Patient would not like information    Additional Information 1:1 In Past 12 Months?: No CIRT Risk: Yes Elopement Risk: Yes Does patient have medical clearance?: Yes     Disposition:  Disposition Disposition of Patient: Referred to [facility])  On Site Evaluation by:   Reviewed with Physician:     Romeo Apple 06/30/2011 11:02 AM

## 2011-06-30 NOTE — ED Notes (Signed)
Pt agreed to cooperate with staff if restraints were removed.  Restraints were removed and a tray was given to pt.  Pt began to yell, curse and threaten staff.  Did not respond favorably to alternate behavior techniques.  Refused PO medication.  Restraints reapplied and IM meds given.

## 2011-06-30 NOTE — ED Notes (Addendum)
Pt becoming agitated, GPD and sitter at bedside. Pt refusing breakfast, does not want to speak with staff. Pt states she wants cigarette, refuses nicotine patch multiple times. States she does not want "any more medicine". Will continue to assess

## 2011-06-30 NOTE — Progress Notes (Signed)
This Clinical research associate reviewed clinical information with Dr. Allena Katz who declined patient due to acuity. Samson Frederic in assessment office informed Servando Snare. With ACT. Doristine Locks RN [facility]

## 2011-06-30 NOTE — BHH Counselor (Signed)
TC from [employer] @ [facility]. Stated that pt has been compliant w/ medications and is due for her next Rx injection next week. Stated that Dr. Ladona Ridgel had been discussing increasing the amount, as she has been growing more easily agitated & manic over the past 2 months. Marcelino Duster also stated recent upsetting event was over trying to get her out of [facility] into her own apartment. Stated they had filled out the application, but due to pt getting a Possession of THC charge with the court date of 7/15. Apartment complex told her that she would not qualify with the drug charges, but are holding the apartment for her to see if she possibly gets a dismissal for this charge. Marcelino Duster stated that pt had been doing very well lately, as they were attempting to get her an apartment and looking to get her rights restored. She stated that pt has taken responsibility & ownership for using the [facility], but cried for days when she found out about the possibility of loosing the apartment, as she is not so happy at [facility]. She confirmed pt has MCD and gave her number. Asked for someone to give her a call when placement was sought [phone])

## 2011-06-30 NOTE — ED Notes (Signed)
Pt ambulating to bathroom and back with sitter without difficulty and cooperative

## 2011-06-30 NOTE — ED Notes (Signed)
Charge at bedside. Pt agreed to lower her voice and try and get some rest. Pt now lying quietly in bed with sitter at bedside.

## 2011-06-30 NOTE — BHH Counselor (Signed)
[facility]: [phone]. Spoke with Amy, the director Pt's guardian: Tessie Fass with [place] [facility] [phone]). TC to [place] - left her a message that pt is in the ED.

## 2011-06-30 NOTE — BHH Counselor (Signed)
Pt declined @ [facility] by Dr. Dan Humphreys due to pt acuity. Faxed info to [employer] for review.
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
## 2011-06-30 NOTE — ED Notes (Signed)
Belongings bag x 1 locked in locker #43 in hallway by Psych ED.

## 2011-06-30 NOTE — ED Notes (Signed)
Pt becoming increasingly louder, verbally aggressive, and using profanity. Pt states she wants a cigarette and will walk out if she does not get one. Pt offered a nicotine patch. Pt refused incotine patch and threatening to walk out with her purse. [employer], sitter, and charge at bedside

## 2011-06-30 NOTE — ED Notes (Signed)
[id number]<AV> Expected date:06/30/11<BR> Expected time: 3:26 AM<BR> Means of arrival:Ambulance<BR> Comments:<BR> Hold for medical clearance

## 2011-06-30 NOTE — ED Notes (Signed)
While restraining pt's leg pt raised up in bed and stated "Let me out of here. I want to smoke a fucking cigarette. If you don't let me up I know just how to kill myself." Asked pt how she was going to do that and patient responded "I'm gonna hit myself in my chest as hard as I can with my fist and kill myself." Patient continues to scream out for a cigarette.

## 2011-06-30 NOTE — ED Notes (Signed)
Pt was offered toileting but refused then urinated in bed while in restraints.  Was removed from restraints and allowed to ambulate to bathroom and given supplies to clean up with.  Pt refused to clean and dress but continued to yell, curse and threaten staff.  Pt was escorted back to room and restraints reapplied.  Pt covered with a sheet as she refuses to dress in gown.

## 2011-06-30 NOTE — BHH Counselor (Signed)
TC to [facility] and informed no beds are available.
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
## 2011-06-30 NOTE — BHH Counselor (Signed)
Completed telepsych paperwork and faxed to [facility]. TC to [facility] but was informed that this call was already in place. Pending telepsych.

## 2011-06-30 NOTE — ED Provider Notes (Signed)
History     CSN: [id number]  Arrival date & time 06/30/11  4098   First MD Initiated Contact with Patient 06/30/11 [phone]      Chief Complaint  Patient presents with  . Medical Clearance    (Consider location/radiation/quality/duration/timing/severity/associated sxs/prior treatment) HPI 36 year old [facility] with psychosis.  They were unable to get the patient under control, and called 911.  No IVC paperwork completed.  Pt presents here with flight of ideas, yelling, agitated, speaking several languages.  Paperwork from [place] gives diagnosis of schizoaffective disorder.  Pt was due haldol depot shot today, but was not given it due to agitation.  Pt reports she is tired of living at [facility], that she is broke, hungry, thirsty, and has spent all day walking the streets.  Pt seems to be responding to internal stimuli, rocking in the bed and nodding her head.  Pt is shouting.  She does not follow commands well or answer questions.    Past Medical History  Diagnosis Date  . Schizoaffective disorder   . Schizophrenia   . Borderline personality disorder     History reviewed. No pertinent past surgical history.  No family history on file.  History  Substance Use Topics  . Smoking status: Not on file  . Smokeless tobacco: Not on file  . Alcohol Use: Yes    OB History    Grav Para Term Preterm Abortions TAB SAB Ect Mult Living                  Review of Systems  Unable to perform ROS: Psychiatric disorder    Allergies  Antihistamines, chlorpheniramine-type  Home Medications  No current outpatient prescriptions on file.  BP 113/87  Pulse 89  Temp 99.5 F (37.5 C) (Oral)  Resp 20  SpO2 98%  Physical Exam  Nursing note and vitals reviewed. Constitutional: She appears well-developed and well-nourished. She appears distressed (agitated,angry, psychotic).  HENT:  Head: Normocephalic and atraumatic.  Nose: Nose normal.    Mouth/Throat: Oropharynx is clear and moist.  Eyes: Conjunctivae and EOM are normal. Pupils are equal, round, and reactive to light.  Neck: Normal range of motion. Neck supple. No JVD present. No tracheal deviation present. No thyromegaly present.  Cardiovascular: Normal rate, regular rhythm, normal heart sounds and intact distal pulses.  Exam reveals no gallop and no friction rub.   No murmur heard. Pulmonary/Chest: Effort normal. No stridor. No respiratory distress. She has no wheezes. She has no rales. She exhibits no tenderness.  Abdominal: Soft. Bowel sounds are normal. She exhibits no distension and no mass. There is no tenderness. There is no rebound and no guarding.  Musculoskeletal: Normal range of motion. She exhibits no edema and no tenderness.  Lymphadenopathy:    She has no cervical adenopathy.  Skin: Skin is warm and dry. No rash noted. No erythema. No pallor.  Psychiatric:       Flight of ideas, angry, pressure speech, psychosis    ED Course  Procedures (including critical care time)  CRITICAL CARE Performed by: Olivia Mackie   Total critical care time: 65  Critical care time was exclusive of separately billable procedures and treating other patients.  Critical care was necessary to treat or prevent imminent or life-threatening deterioration.  Critical care was time spent personally by me on the following activities: development of treatment plan with patient and/or surrogate as well as nursing, discussions with consultants, evaluation of patient's response to  treatment, examination of patient, obtaining history from patient or surrogate, ordering and performing treatments and interventions, ordering and review of laboratory studies, ordering and review of radiographic studies, pulse oximetry and re-evaluation of patient's condition.   Labs Reviewed  CBC  COMPREHENSIVE METABOLIC PANEL  URINE RAPID DRUG SCREEN (HOSP PERFORMED)  PREGNANCY, URINE   No results  found.   No diagnosis found.    MDM  36 yo female with acute on chronic psychosis.  Will get IVC paperwork, give geodon for acute psychosis and plan to give her haldol dosing as well.  6:53 AM Patient has been given Geodon 20 mg and 2 mg of Ativan. She is still exhibiting bizarre behavior, agitation. We'll give second dose of Geodon.      7:43 AM Care passed to Dr Effie Shy.  Awaiting ACT assessment, placement.  Olivia Mackie, MD 06/30/11 [phone]

## 2011-06-30 NOTE — ED Notes (Signed)
Pt resting quietly.

## 2011-06-30 NOTE — ED Provider Notes (Signed)
Patient continues to be verbally abusive, confused, and delusional. She has required physical restraint to keep her in the bed. Officers are at her bedside, but has not had to intervene. Tele-psychiatric consultation is requested, emergently. We will need help with medication recommendations. The patient has not responded well to Geodon, and Ativan.   Tele-psychiatrist, has seen the patient and found her to be manic she recommended additional IM dosing of Haldol, Ativan, and Cogentin one time; followed by oral scheduled dosing of each. Psychiatric placement is recommended.    Flint Melter, MD 06/30/11 1051

## 2011-07-01 DIAGNOSIS — F603 Borderline personality disorder: Secondary | ICD-10-CM

## 2011-07-01 DIAGNOSIS — F259 Schizoaffective disorder, unspecified: Secondary | ICD-10-CM

## 2011-07-01 DIAGNOSIS — F1210 Cannabis abuse, uncomplicated: Secondary | ICD-10-CM

## 2011-07-01 LAB — URINE MICROSCOPIC-ADD ON

## 2011-07-01 LAB — URINALYSIS, ROUTINE W REFLEX MICROSCOPIC
Bilirubin Urine: NEGATIVE
Protein, ur: NEGATIVE mg/dL
Urobilinogen, UA: 0.2 mg/dL (ref 0.0–1.0)

## 2011-07-01 MED ORDER — HALOPERIDOL LACTATE 5 MG/ML IJ SOLN
10.0000 mg | Freq: Once | INTRAMUSCULAR | Status: AC
  Administered 2011-07-01: 10 mg via INTRAMUSCULAR
  Filled 2011-07-01: qty 2

## 2011-07-01 MED ORDER — DIVALPROEX SODIUM ER 500 MG PO TB24
500.0000 mg | ORAL_TABLET | Freq: Two times a day (BID) | ORAL | Status: DC
  Administered 2011-07-01 – 2011-07-02 (×3): 500 mg via ORAL
  Filled 2011-07-01 (×4): qty 1

## 2011-07-01 MED ORDER — LORAZEPAM 2 MG/ML IJ SOLN
2.0000 mg | Freq: Once | INTRAMUSCULAR | Status: AC
  Administered 2011-07-01: 2 mg via INTRAMUSCULAR
  Filled 2011-07-01: qty 1

## 2011-07-01 NOTE — BHH Counselor (Signed)
Per Marcelino Duster, Pt declined at [facility] due to aggression.   No beds at [facility], [facility], [address], [place], [place], or [place].   Pt's disposition pending Mikey Bussing, [place] [facility].  Information faxed to [facility]. Authorization number is [id number].

## 2011-07-01 NOTE — ED Notes (Signed)
While checking on patient this writer noticed patient had one arm out of her restraints. Patient still demanding a cigarette and agitated. Pt did allow this writer to put arm back into restraint.
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
## 2011-07-01 NOTE — ED Notes (Signed)
Pt on phone talking to "April" telling her she "wants to die tonight."

## 2011-07-01 NOTE — ED Notes (Signed)
Pt constantly asking for a drink, a blunt and a cigarette. Refuses offer from RN for a nicotine patch multiple times.

## 2011-07-01 NOTE — ED Notes (Signed)
Patient trying to get out of psych ED doors. Tried to redirect pt back to room. Patient starts yelling "I need to get out and get my walkman. I want my walkman". Explained to patient that she must use softer voice. Patient then states "I want my walkman. I'm tired of being in here looking stupid. I'm gonna get arrested so I can get out of here." Patient was redirected back to room.

## 2011-07-01 NOTE — ED Notes (Addendum)
Pt got loose from right arm restraint and attempted to remove other restraints. Restraint placed on right arm again.

## 2011-07-01 NOTE — ED Notes (Signed)
Pt's aunt is April Pierce [phone] if any info is needed.

## 2011-07-01 NOTE — ED Provider Notes (Signed)
Filed Vitals:   07/01/11 2208  BP: 118/84  Pulse: 82  Temp: 98.3 F (36.8 C)  Resp: 18   Pt seen and assessed. In 4 point restraints. Evaluated for need to continue. Pt resting in bed. No respiratory distress. When aroused combative and argumentative. Neurovascularly intact distally to all 4 restraints. Will continue restraints at this time.  Raeford Razor, MD 07/04/11 2356

## 2011-07-01 NOTE — ED Provider Notes (Signed)
BP 139/97  Pulse 101  Temp 97.9 F (36.6 C) (Oral)  Resp 20  SpO2 99%   Patient seen and evaluated by me. No complaints at this time. She remains slightly tachycardic without cp/sob. Reading bible this morning, asking for a cigarette break. Offered nicotine patch which she refused.   Forbes Cellar, MD 07/01/11 [phone]

## 2011-07-01 NOTE — ED Notes (Signed)
Patient out of room naked. Directed patient back to room. Tried to get patient to put gown back on. Patient refusing stating "I want to take a shower. Let me take a shower". Tried to get patient to wait in room while toiletries retrieved. Pt wondering back out in hall. RN retrieved shower supplies while this writer stayed with pt in bathroom. Patient pushed shower and started yelling "I ain't taking no shower. I ain't getting in this cold water". Explained to pt that I would get the water warm for her. Patient finally agreed to get into shower. Pt yelling "get out of here. Bye Bye". Left pt alone to shower.
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
## 2011-07-01 NOTE — Progress Notes (Signed)
Invited pt to behavioral health group for pt's in Psych ED with Corliss Marcus, MS, MEd, LPCA, NCC. Pt declined, stated she wanted a cigarette and her clothes. Pt appeared agitated, but was cooperative with Clinical research associate and Lennox Laity and easy to engage.  Lamel Mccarley B MS, LPCA, NCC

## 2011-07-01 NOTE — ED Notes (Signed)
This writer sitting outside of patient room to keep pt from wondering hallway and bothering other patient in department. Patient started crying. Asked patient what was the matter. Patient stated "I have to cry. If I don't cry I'll go off up in here and hurt somebody". Patient continues to cry and ask about her belongings. Assured pt her belongings were locked up and safe. This Clinical research associate then left pt in room to get requested cup of water. Provided pt with water. Patient then came storming out of room in a demanding manner and wondering down hallway. RN, Kathlene November went to hallway to redirect pt to room. When pt arrived in room she became aggressive and threw her cup of ice water into RN's face. [employer] and RN secured pt to bed. This writer pushed panic button. ACT team member watched monitors while this writer helped put pt into restraints.

## 2011-07-01 NOTE — ED Notes (Signed)
Pt requires constant redirection while awake. Pt is disorganized and is not able to retain any answers to questions that staff answers for her. Pt continually disrobing and attempting to leave unit. Refuses to put gown back on, paper scrubs are not large enough and pt is naked when in her room. She will usually wrap a sheet loosely around her when she comes to the door. Pt constantly stating she has to leave, that there is nothing wrong with her and that we are keeping her here against her will. Pt does not understand IVC concept. Pt threatens to "bust up out of here" and states that she would rather "cut my wrists" and "die a long and painful death" before she stays here another day. Pt is redirectable momentarily, but usually immediately reverts to psychotic behavior. Pt states she does not want to stay anywhere where she "can't get what I want when I want it".

## 2011-07-01 NOTE — Progress Notes (Signed)
Met briefly with the client along with Juanetta Beets, MS, LPCA, NCC. The client was upset and asked repeatedly if we could take her out of the restraints. She briefly spoke of how nobody in her family loves her. She mentioned her family and mentioned that she has been homeless on the street walking 2 miles. She started falling asleep, so we left the room. Walked in a little later when the patient said that nobody likes her. She asked again if writer could take her out of the restraints, and Clinical research associate said no. Patient asked the writer if she had any money; Clinical research associate said no, and the patient asked the writer to leave and not ask any more questions.  Evelene Croon MA, MED, LPCA, NCC

## 2011-07-01 NOTE — ED Notes (Signed)
Patient requesting candy. Offered pt pop sickle.

## 2011-07-01 NOTE — ED Notes (Signed)
Patient up at desk requesting water. Redirected patient back to room. Provided water.

## 2011-07-01 NOTE — ED Notes (Signed)
Pt was in hallway yelling stating that she needed to pace.  Pt was told she could pace at one end of the hall.  She then began to curse and threaten staff and become aggressive.  Pt was then advised to go back into her room.  She refused, was then being escorted to her room by this nurse when she became combative, swinging at this nurse and throwing a cup of water on me.  [employer] assisted this nurse with getting pt into her room, onto a stretcher and placed into restraints.  Other staff and Dr Hyman Hopes was notified and pt was medicated per order.

## 2011-07-01 NOTE — ED Notes (Signed)
Pt placed in hard restraints due to noncompliance with plan.

## 2011-07-01 NOTE — ED Notes (Signed)
Patient up at desk demanding a cigarette. Trying to redirect pt back to room. Patient asking [employer] officer if he would like to buy one of her bracelets. She doesn't have any money. Told pt to please go back to her room. Patient then starts yelling "I've been good. Why am I being punished. Let me have a GD cigarette. If I don't get what I want I'm gonna start acting up". Explained to pt we were a smoke free campus. Patient stated "I don't give a damn. I want a GD cigarette". Was able to direct pt back to room.

## 2011-07-01 NOTE — ED Notes (Signed)
Patient wondering hallways. Directed patient back to her room. Patient stopped at nursing station and once again asking for cigarette. Told pt unable to provide. Patient states "then why did you stop me. This was a wasted stop. Just give me a GD cigarette".

## 2011-07-01 NOTE — Progress Notes (Signed)
According to [facility] at [facility], pt is currently on the wait list for inpatient treatment.

## 2011-07-01 NOTE — ED Notes (Signed)
Pt was removed from restraints, given opportunity to ambulate, eat and void.  Pt did all three and began to become more aggitated, yelling at staff, not following direction and unable to redirect to room.  Restraints reapplied.

## 2011-07-01 NOTE — ED Notes (Signed)
Patient up at nursing station asking for another place she can go so she will be allowed to smoke. Explained there was no other place. Patient became agitated stating "I've been good. I deserve a cigarette for as long as I've been without one." Explained that she would not be allowed to smoke. Pt redirected back to her room.

## 2011-07-01 NOTE — ED Notes (Signed)
Pt roaming hallways reporting she needs to go to court. Pt not cooperative when told to go to room. Pt states, "I'm tired of this. I'm ready to fight. I need my purse. I need my walkman. I need my cigarettes." Pt threw cup of water at [facility] as he tried to escort her back to the room. Pt restrained to bed.

## 2011-07-01 NOTE — ED Notes (Signed)
Pt was able to free her right arm from restraint.  Was trying to take off left restraint.  Right wrist restraint reapplied.

## 2011-07-02 NOTE — ED Notes (Signed)
Psych note: Pt has persisted in going into other patients rooms, even after warned not to do so multiple times. Pt appears to ignore instructions to follow rules and stay in room during quiet time. She was told if she went into another pt's room again, she would loss her privilege to amb in the hall. She is passive -aggressive in her behavior, choosing to talk about how she "likes" one staff member, but is "angry" at another staff member. She pretends to forget where her room is, so 43 was written on her hand to remind her. She consistently returned to her room for several hours, but now is back to entering into other pt's room. She was told that she is restrained to her room since she continues to enter others.  She stalls when directed to return to her room by insisting she has to go to the bathroom, but then immediately on entering her room finally she then states she has to go to the bathroom again. She goes into her room and disrobes then attempts to return to hall. Pt has been told that her belongings are locked up multiple times. She now is at her doorway insisting that she has to get her belongings. Pt began acting out immediately after being informed that she was being prepared to transfer to [facility]. Security and [employer]  Standing by as pt is becoming more verbally aggressive towards staff.

## 2011-07-02 NOTE — ED Provider Notes (Signed)
Informed by nursing staff that pt needs reassess per restraint protocol. Pt resting comfortably. No distress. Staff notes when arouses, needs restraints for pt and staff safety.   Suzi Roots, MD 07/02/11 [phone]

## 2011-07-02 NOTE — ED Provider Notes (Signed)
Clydie Braun, ACT states accepted at [facility] by Lorine Bears, RN under IVC papers.   Ward Givens, MD 07/02/11 1609

## 2011-07-02 NOTE — BH Assessment (Signed)
Assessment Note   Cynthia Good is an 36 y.o. female. Pt comes in with [employer] brought IVC from [facility]. Pt was having flight of ideas, manic behavior, speaking loudly and in different languages. Pt denied SI, but made several remarks about losing her will to live due to being homeless. Pt stated she was a resident at [facility] but did not wish to stay there for them to keep getting disability money. Pt remarked that she "often times walks the streets because the busses run too slow and that Ginette Otto is too slow" and stated she wanted to go to [place]. Pt denied hearing voices or seeing things, prior to being asked, but did appear to be responding to internal stimuli at times. She was labile, going from yelling and angry to softer, tearful times. Pt exhibited a flight of ideas, tangential thoughts and was restless during the assessment. Pt was irritable, agitated and made numerous comments about not wanting to be on her medications. Pt stated she was scheduled to get her next dose of medication yesterday, but missed this appointment. Pt's POA is Tessie Fass at [employer] and she sees Quarry manager at [employer]. Spoke with Amy, the director at [facility] who stated pt had left yesterday evening after making comments about harming/killing herself. She stated pt does have hx of SA (THC & Cocaine) and that pt is always "loud,obnoxious and disturbing". Amy did state that pt can return to their facility once she has been stabilized.  Pt continued to be verbally and physically aggressive during ED stay. Required restraints multiple times. Pt accepted to [facility]. Completed paperwork required and pt transported via [facility].  Axis I: Schizoaffective Disorder and Substance Abuse Axis II: Cluster B Traits Axis III:  Past Medical History  Diagnosis Date  . Schizoaffective disorder   . Schizophrenia   . Borderline personality disorder    Axis IV: housing problems, other psychosocial or environmental  problems, problems related to legal system/crime and problems related to social environment Axis V: 11-20 some danger of hurting self or others possible OR occasionally fails to maintain minimal personal hygiene OR gross impairment in communication  Past Medical History:  Past Medical History  Diagnosis Date  . Schizoaffective disorder   . Schizophrenia   . Borderline personality disorder     History reviewed. No pertinent past surgical history.  Family History: No family history on file.  Social History:  does not have a smoking history on file. She does not have any smokeless tobacco history on file. She reports that she drinks alcohol. Her drug history not on file.  Additional Social History:  Alcohol / Drug Use Pain Medications: N/A Prescriptions: See PTA List Over the Counter: N/A History of alcohol / drug use?: Yes Longest period of sobriety (when/how long): Unknown Substance #1 Name of Substance 1: THC 1 - Age of First Use: Unknown 1 - Amount (size/oz): Unknown 1 - Frequency: Unknown 1 - Duration: Unknown 1 - Last Use / Amount: Pt was positive for THC in UDS 06/30/11  CIWA: CIWA-Ar BP: 109/77 mmHg Pulse Rate: 88  COWS:    Allergies:  Allergies  Allergen Reactions  . Antihistamines, Chlorpheniramine-Type     Home Medications:  (Not in a hospital admission)  OB/GYN Status:  No LMP recorded.  General Assessment Data Location of Assessment: [facility] Living Arrangements: Other (Comment) ([facility]) Can pt return to current living arrangement?: Yes Admission Status: Involuntary Is patient capable of signing voluntary admission?: No Transfer  from: Acute Hospital Referral Source: Other  Education Status Is patient currently in school?: No Contact person: Tessie Fass @ [facility]  Risk to self Suicidal Ideation: No-Not Currently/Within Last 6 Months Suicidal Intent: No-Not Currently/Within Last 6 Months Is patient at risk for suicide?: No Suicidal Plan?:  No Access to Means: No What has been your use of drugs/alcohol within the last 12 months?: THC Previous Attempts/Gestures: No How many times?: 0  Other Self Harm Risks: N/A Triggers for Past Attempts: None known Intentional Self Injurious Behavior: None Family Suicide History: Unknown Recent stressful life event(s): Legal Issues;Trauma (Comment) (Recently had legal charges; unable to get apartment) Persecutory voices/beliefs?: Yes Depression: Yes Depression Symptoms: Insomnia;Feeling angry/irritable Substance abuse history and/or treatment for substance abuse?: Yes Suicide prevention information given to non-admitted patients: Not applicable  Risk to Others Homicidal Ideation: No-Not Currently/Within Last 6 Months Thoughts of Harm to Others: Yes-Currently Present (Making threats to staff) Comment - Thoughts of Harm to Others: threatening remarks to staff Current Homicidal Intent: No-Not Currently/Within Last 6 Months Current Homicidal Plan: No-Not Currently/Within Last 6 Months Access to Homicidal Means: No Identified Victim: hospital staff History of harm to others?: No Assessment of Violence: On admission Violent Behavior Description: verbally abusive Does patient have access to weapons?: No Criminal Charges Pending?: Yes Describe Pending Criminal Charges: Possession of THC Does patient have a court date: Yes Court Date: 07/19/11  Psychosis Hallucinations: None noted Delusions: Persecutory  Mental Status Report Appear/Hygiene: Disheveled;Body odor;Poor hygiene Eye Contact: Fair Motor Activity: Restlessness;Agitation Speech: Aggressive;Argumentative;Loud Level of Consciousness: Alert;Restless;Combative;Irritable Mood: Angry;Irritable Affect: Irritable;Angry;Threatening Anxiety Level: Minimal Thought Processes: Coherent Judgement: Impaired Orientation: Person;Place;Time;Situation Obsessive Compulsive Thoughts/Behaviors: None  Cognitive Functioning Concentration:  Decreased Memory: Recent Intact;Remote Intact IQ: Average Insight: Poor Impulse Control: Poor Appetite: Good Weight Loss: 0  Weight Gain: 0  Sleep: No Change Vegetative Symptoms: Decreased grooming  ADLScreening [facility] Assessment Services) Patient's cognitive ability adequate to safely complete daily activities?: Yes Patient able to express need for assistance with ADLs?: Yes Independently performs ADLs?: Yes  Abuse/Neglect [facility]) Physical Abuse: Denies Verbal Abuse: Denies Sexual Abuse: Denies  Prior Inpatient Therapy Prior Inpatient Therapy: Yes Prior Therapy Dates: May 2006 Prior Therapy Facilty/Provider(s): [facility] Reason for Treatment: Psychosis  Prior Outpatient Therapy Prior Outpatient Therapy: Yes Prior Therapy Dates: Current Prior Therapy Facilty/Provider(s): [facility] Reason for Treatment: Schizoaffective/BPD  ADL Screening (condition at time of admission) Patient's cognitive ability adequate to safely complete daily activities?: Yes Patient able to express need for assistance with ADLs?: Yes Independently performs ADLs?: Yes       Abuse/Neglect Assessment (Assessment to be complete while patient is alone) Physical Abuse: Denies Verbal Abuse: Denies Sexual Abuse: Denies Exploitation of patient/patient's resources: Denies Self-Neglect: Denies Values / Beliefs Cultural Requests During Hospitalization: None Spiritual Requests During Hospitalization: None   Advance Directives (For Healthcare) Advance Directive: Patient does not have advance directive;Patient would not like information    Additional Information 1:1 In Past 12 Months?: No CIRT Risk: Yes Elopement Risk: Yes Does patient have medical clearance?: Yes     Disposition:  Disposition Disposition of Patient: Inpatient treatment program Type of inpatient treatment program: Adult Patient referred to: [facility] (Pt accepted to [facility] - transported via [facility])  On Site Evaluation by:   Reviewed with  Physician:     Romeo Apple 07/02/2011 6:24 PM

## 2011-07-02 NOTE — Consult Note (Signed)
Reason for Consult:Schizoaffective disorder, MRE is mania Referring Physician: Dr. Izell Bristol Cynthia Good is an 36 y.o. female.  HPI: Patient stated that she has been feeling better and not been irritable, agitated, aggressive during this morning. Patient stated that she will does not like the treatment. She receives from the [facility], but prefers to go to [facility] instead of being homeless. When she get discharged from the hospital. She has increased agitation and aggressive behavior throughout the night and that her restraints was removed at 6:00 a.m.this morning. She has received restraints few hours daily in 2-3 days in a row and also required intramuscular medication therapy. Patient needs to maintain on her medication without manic symptoms at least 24 hours before she can be discharged out of the hospital, if not she need to be followup [facility]  Patient was calm this morning, talking with her normal voice and easily redirectable. Patient denies depression, anxiety, and psychotic symptoms. Patient has no suicidal, homicidal ideations. Patient has poor insight, judgment and impulse control   Past Medical History  Diagnosis Date  . Schizoaffective disorder   . Schizophrenia   . Borderline personality disorder     History reviewed. No pertinent past surgical history.  No family history on file.  Social History:  does not have a smoking history on file. She does not have any smokeless tobacco history on file. She reports that she drinks alcohol. Her drug history not on file.  Allergies:  Allergies  Allergen Reactions  . Antihistamines, Chlorpheniramine-Type     Medications: I have reviewed the patient's current medications.  Results for orders placed during the hospital encounter of 06/30/11 (from the past 48 hour(s))  URINALYSIS, ROUTINE W REFLEX MICROSCOPIC     Status: Abnormal   Collection Time   07/01/11  7:47 AM   Component Value Range Comment   Color, Urine COLORLESS (*) YELLOW    APPearance CLEAR  CLEAR    Specific Gravity, Urine 1.009  1.005 - 1.030    pH 6.0  5.0 - 8.0    Glucose, UA NEGATIVE  NEGATIVE mg/dL    Hgb urine dipstick SMALL (*) NEGATIVE    Bilirubin Urine NEGATIVE  NEGATIVE    Ketones, ur NEGATIVE  NEGATIVE mg/dL    Protein, ur NEGATIVE  NEGATIVE mg/dL    Urobilinogen, UA 0.2  0.0 - 1.0 mg/dL    Nitrite NEGATIVE  NEGATIVE    Leukocytes, UA NEGATIVE  NEGATIVE   URINE MICROSCOPIC-ADD ON     Status: Normal   Collection Time   07/01/11  7:47 AM      Component Value Range Comment   Squamous Epithelial / LPF RARE  RARE    WBC, UA 0-2  <3 WBC/hpf    RBC / HPF 0-2  <3 RBC/hpf    Bacteria, UA RARE  RARE     No results found.  No depression, No anxiety, No psychosis and Positive for aggressive behavior, bipolar, mood swings, obesity, sleep disturbance and bipolar mania and personality disorder Blood pressure 139/87, pulse 96, temperature 97.9 F (36.6 C), temperature source Oral, resp. rate 20, SpO2 97.00%.   Assessment/Plan: Schizoaffective disorder, MRE is mania   Recommendation: Patient was not stable at this time for the discharge to the [facility]. Recommended continue her current medication therapy, Depakote can be titrated up to 2000 mg as needed and monitor blood valproic acid levels and monitor for clinical response to her mood  stabilizers and antipsychotics. Monitor for the extrapyramidal symptoms.    Cynthia Good,Cynthia R. 07/02/2011, 9:37 AM

## 2011-07-02 NOTE — BHH Counselor (Signed)
TC to [employer] @ [facility]. [phone]). Left message to let her know that pt was transported to [facility].

## 2011-07-02 NOTE — ED Provider Notes (Signed)
Nurses report patient was in restraints most on night. Pt states she finally slept well. States she has been crying the past 3 months b/o deaths in her family and fear that she is going to die next "and go to hell". States she is ready to go back to [employer].   Will have psychiatrist evaluate her this morning for medication recommendations.  Devoria Albe, MD, FACEP     Ward Givens, MD 07/02/11 [phone]

## 2011-07-02 NOTE — ED Notes (Signed)
MD at bedside,Dr. Elesa Massed. POC for meds to be evaluated by Psychiatrists. Pt informed of plan of care.

## 2011-07-02 NOTE — ED Notes (Signed)
Took meds w/out incident.Marland KitchenMarland KitchenMarland Kitchen

## 2011-07-02 NOTE — ED Notes (Signed)
Faxed report, VS, and MAR to [facility].

## 2011-07-02 NOTE — ED Notes (Signed)
MD at bedside. 

## 2011-07-02 NOTE — Progress Notes (Signed)
Per Elnita Maxwell at [facility], pt has been accepted for inpatient treatment by RN Lorine Bears. EDP notified and is in agreement with the disposition. RN made aware. Per Elnita Maxwell, there is no need for the RN at [facility] to call report to [facility].  Pt is under IVC and will transfer by sheriff. RN has called for sheriff transport. No further needs identified at this time.

## 2013-12-04 ENCOUNTER — Encounter (HOSPITAL_COMMUNITY): Payer: Self-pay

## 2013-12-04 ENCOUNTER — Emergency Department (HOSPITAL_COMMUNITY)
Admission: EM | Admit: 2013-12-04 | Discharge: 2013-12-04 | Disposition: A | Discharge status: Home / Self Care | Payer: Medicaid Other | Attending: Emergency Medicine | Admitting: Emergency Medicine

## 2013-12-04 DIAGNOSIS — S199XXA Unspecified injury of neck, initial encounter: Secondary | ICD-10-CM | POA: Diagnosis present

## 2013-12-04 DIAGNOSIS — F319 Bipolar disorder, unspecified: Secondary | ICD-10-CM | POA: Diagnosis not present

## 2013-12-04 DIAGNOSIS — Y92410 Unspecified street and highway as the place of occurrence of the external cause: Secondary | ICD-10-CM | POA: Insufficient documentation

## 2013-12-04 DIAGNOSIS — S4991XA Unspecified injury of right shoulder and upper arm, initial encounter: Secondary | ICD-10-CM | POA: Insufficient documentation

## 2013-12-04 DIAGNOSIS — Z720 Tobacco use: Secondary | ICD-10-CM | POA: Diagnosis not present

## 2013-12-04 DIAGNOSIS — S3992XA Unspecified injury of lower back, initial encounter: Secondary | ICD-10-CM | POA: Diagnosis not present

## 2013-12-04 DIAGNOSIS — Z79899 Other long term (current) drug therapy: Secondary | ICD-10-CM | POA: Diagnosis not present

## 2013-12-04 DIAGNOSIS — Y998 Other external cause status: Secondary | ICD-10-CM | POA: Diagnosis not present

## 2013-12-04 DIAGNOSIS — S4992XA Unspecified injury of left shoulder and upper arm, initial encounter: Secondary | ICD-10-CM | POA: Insufficient documentation

## 2013-12-04 DIAGNOSIS — Y9389 Activity, other specified: Secondary | ICD-10-CM | POA: Insufficient documentation

## 2013-12-04 MED ORDER — TRAMADOL HCL 50 MG PO TABS
50.0000 mg | ORAL_TABLET | Freq: Once | ORAL | Status: AC
Start: 2013-12-04 — End: 2013-12-04
  Administered 2013-12-04: 50 mg via ORAL
  Filled 2013-12-04: qty 1

## 2013-12-04 MED ORDER — TRAMADOL HCL 50 MG PO TABS: 50.0000 mg | ORAL_TABLET | Freq: Four times a day (QID) | ORAL | Status: AC | PRN

## 2013-12-04 MED ORDER — CYCLOBENZAPRINE HCL 10 MG PO TABS: 10.0000 mg | ORAL_TABLET | Freq: Two times a day (BID) | ORAL | Status: AC | PRN

## 2013-12-04 NOTE — Discharge Instructions (Signed)
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
## 2013-12-04 NOTE — ED Notes (Signed)
Bed: [facility] Expected date:  Expected time:  Means of arrival:  Comments: EMS 
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
